# Patient Record
Sex: Male | Born: 1945 | Race: Black or African American | Hispanic: No | Marital: Single
Health system: Southern US, Community
[De-identification: ages and names within clinical notes are randomized; demographics above are authoritative.]

---

## 2015-11-20 ENCOUNTER — Encounter: Payer: Self-pay | Admitting: Sports Medicine

## 2015-11-20 ENCOUNTER — Ambulatory Visit (INDEPENDENT_AMBULATORY_CARE_PROVIDER_SITE_OTHER): Payer: Medicare Other | Admitting: Sports Medicine

## 2015-11-20 DIAGNOSIS — R600 Localized edema: Secondary | ICD-10-CM

## 2015-11-20 DIAGNOSIS — Z89511 Acquired absence of right leg below knee: Secondary | ICD-10-CM

## 2015-11-20 DIAGNOSIS — B351 Tinea unguium: Secondary | ICD-10-CM

## 2015-11-20 DIAGNOSIS — Z89521 Acquired absence of right knee: Secondary | ICD-10-CM

## 2015-11-20 DIAGNOSIS — M79673 Pain in unspecified foot: Secondary | ICD-10-CM

## 2015-11-20 NOTE — Progress Notes (Signed)
Patient ID: Leon Skinner, male   DOB: 1945/12/23, 70 y.o.   MRN: [id number] Subjective: Leon Skinner is a 70 y.o. male patient seen today in office with complaint of painful thickened and elongated toenails; unable to trim. Patient denies history of Diabetes or Neuropathy, Hx of Right BKA 3 years ago. Patient has no other pedal complaints at this time.   There are no active problems to display for this patient.  No current outpatient prescriptions on file prior to visit.   No current facility-administered medications on file prior to visit.   Not on File     Objective: Physical Exam  General: Well developed, nourished, no acute distress, awake, alert and oriented x 3 wheelchair gait  Vascular: Dorsalis pedis artery 0/4 on left, Posterior tibial artery 0/4 left, no ischemia, no gangrene, no rest pain, skin temperature warm to warm proximal to distal, no varicosities, no pedal hair present on left. Mild pitting edema on left foot.   Neurological: Gross sensation present via light touch on left.    Dermatological: Skin is warm, dry, and supple bilateral, Nails 1-5 on left are tender, long, thick, and discolored with mild subungal debris, no webspace macerations present, no open lesions present, no callus/corns/hyperkeratotic tissue present. No signs of infection.  Musculoskeletal: Right BKA. Muscular strength within normal limits without pain or limitation on range of motion on left. No pain with calf compression on left  Assessment and Plan:  Problem List Items Addressed This Visit    None    Visit Diagnoses    Dermatophytosis of nail    -  Primary    Foot pain, unspecified laterality        Hx of BKA, right        Localized edema        Left       -Examined patient.  -Discussed treatment options for painful mycotic nails. -Mechanically debrided and reduced mycotic nails with sterile nail nipper and dremel nail file without incident. -Recommend lower leg elevation to assist  with edema control -Will continue to monitor vascular status if changes or worsens will consult vascular for intervention. -Patient to return in 3 months for follow up evaluation or sooner if symptoms worsen.  Asencion Islam, DPM

## 2015-12-25 ENCOUNTER — Other Ambulatory Visit: Payer: Self-pay | Admitting: Orthopedic Surgery

## 2015-12-25 DIAGNOSIS — M25572 Pain in left ankle and joints of left foot: Secondary | ICD-10-CM

## 2016-01-05 ENCOUNTER — Ambulatory Visit
Admission: RE | Admit: 2016-01-05 | Discharge: 2016-01-05 | Disposition: A | Discharge status: Home / Self Care | Payer: Medicare Other | Source: Ambulatory Visit | Attending: Orthopedic Surgery | Admitting: Orthopedic Surgery

## 2016-01-05 DIAGNOSIS — R609 Edema, unspecified: Secondary | ICD-10-CM | POA: Insufficient documentation

## 2016-01-05 DIAGNOSIS — M25872 Other specified joint disorders, left ankle and foot: Secondary | ICD-10-CM | POA: Diagnosis not present

## 2016-01-05 DIAGNOSIS — M19072 Primary osteoarthritis, left ankle and foot: Secondary | ICD-10-CM | POA: Insufficient documentation

## 2016-01-05 DIAGNOSIS — M84372A Stress fracture, left ankle, initial encounter for fracture: Secondary | ICD-10-CM | POA: Diagnosis not present

## 2016-01-05 DIAGNOSIS — M25572 Pain in left ankle and joints of left foot: Secondary | ICD-10-CM | POA: Diagnosis not present

## 2016-01-05 MED ORDER — GADOBENATE DIMEGLUMINE 529 MG/ML IV SOLN
15.0000 mL | Freq: Once | INTRAVENOUS | Status: AC | PRN
  Administered 2016-01-05: 6 mL via INTRAVENOUS

## 2016-02-19 ENCOUNTER — Ambulatory Visit: Payer: Medicare Other | Admitting: Sports Medicine

## 2016-08-17 IMAGING — MR MR ANKLE*L* WO/W CM
10 series · 40 of 40 positions shown · IV contrast (multihance)
Comparison: None.

CLINICAL DATA: Generalized left neck pain and swelling after
falling 3 weeks ago. Renal insufficiency. Evaluate for infection.

EXAM:
MRI OF THE LEFT ANKLE WITHOUT AND WITH CONTRAST
TECHNIQUE: Multiplanar, multisequence MR imaging of the ankle was performed
before and after the administration of intravenous contrast.
CONTRAST:  6mL MULTIHANCE GADOBENATE DIMEGLUMINE 529 MG/ML IV SOLN

[Series 4: PD fat-sat · axial · 3.0mm · 0.35mm/px · z∈[-100,+77]mm · 4 of 50 slices shown]
[im 1/50]
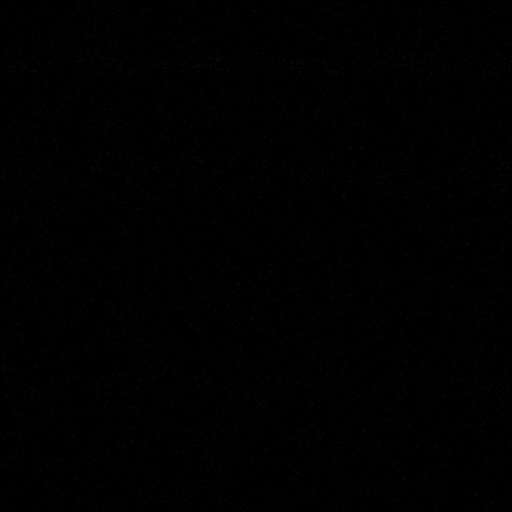
[im 17/50]
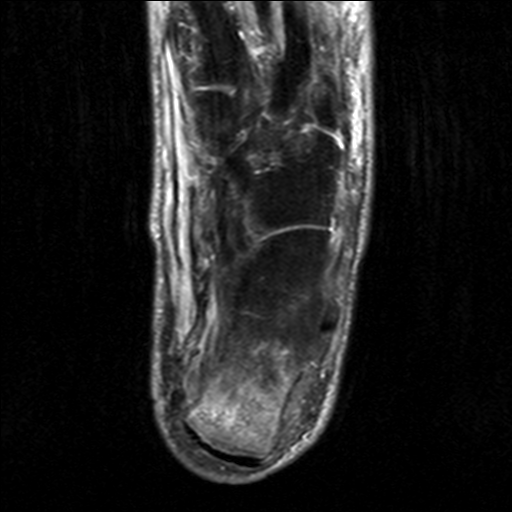
[im 33/50]
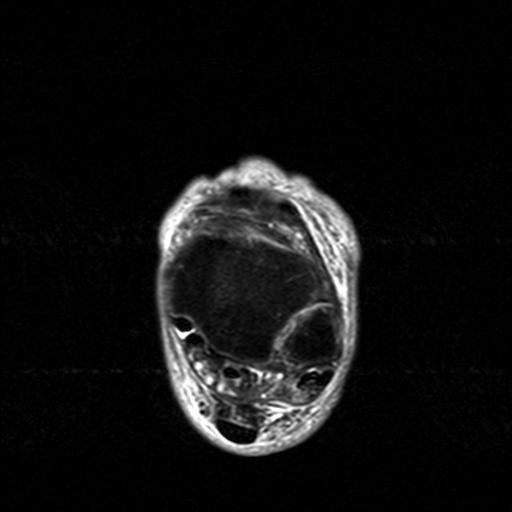
[im 50/50]
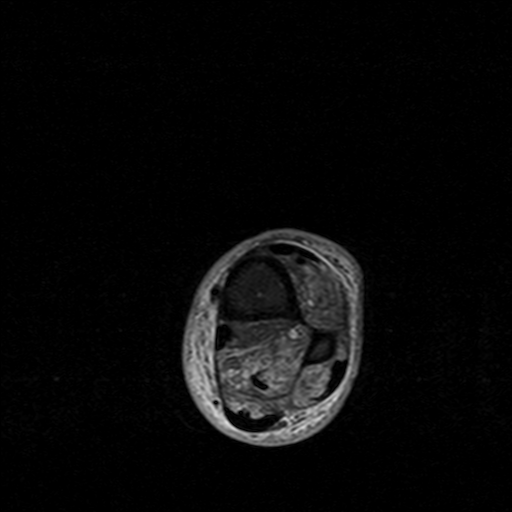

[Series 5: T2 fat-sat · axial · 3.0mm · 0.47mm/px · z∈[-100,+77]mm · 4 of 50 slices shown (1 of 2)]
[im 1/50]
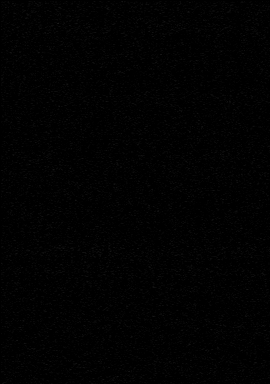
[im 17/50]
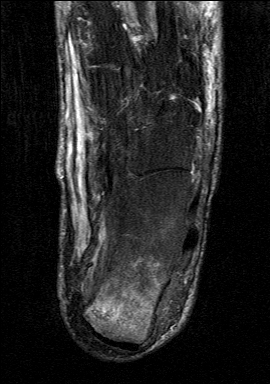
[im 33/50]
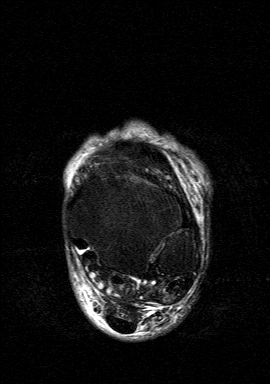
[im 50/50]
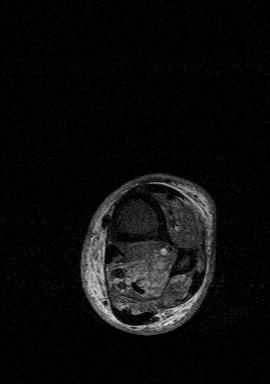

[Series 6: T1 · axial · 3.0mm · 0.70mm/px · z∈[-100,+77]mm · 4 of 50 slices shown (1 of 2)]
[im 1/50]
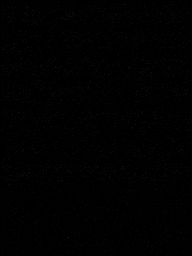
[im 17/50]
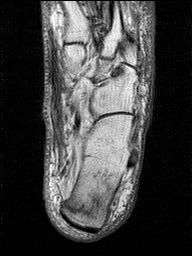
[im 33/50]
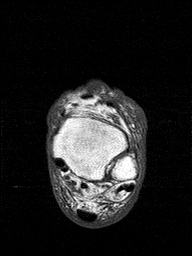
[im 50/50]
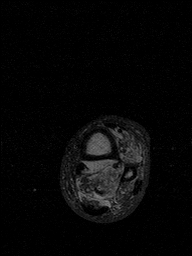

[Series 7: axial t1fs · axial · 3.0mm · 0.70mm/px · z∈[-100,+77]mm · 4 of 50 slices shown]
[im 1/50]
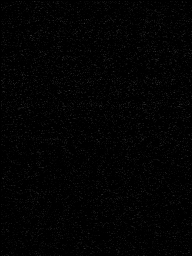
[im 17/50]
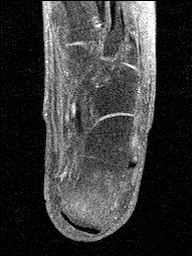
[im 33/50]
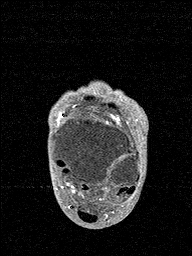
[im 50/50]
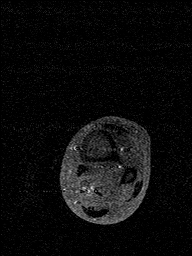

[Series 8: STIR · coronal · 3.0mm · 0.70mm/px · 5 of 50 slices shown]
[im 1/50]
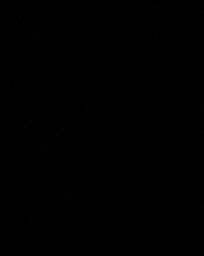
[im 13/50]
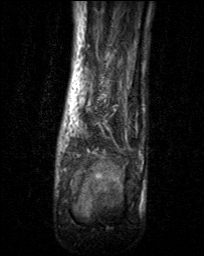
[im 25/50]
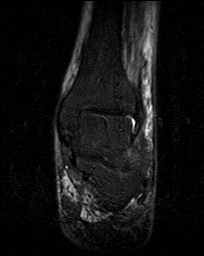
[im 37/50]
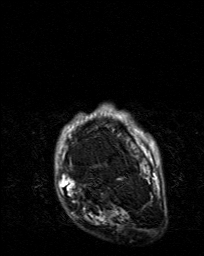
[im 50/50]
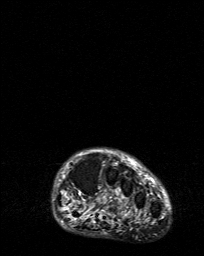

[Series 9: T1 · sagittal · 3.0mm · 0.70mm/px · 3 of 29 slices shown (2 of 2)]
[im 1/29]
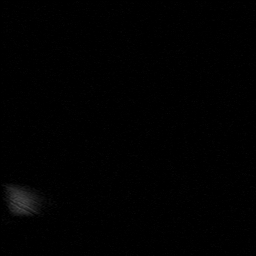
[im 15/29]
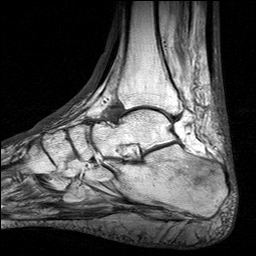
[im 29/29]
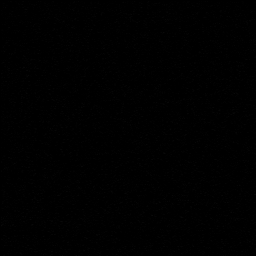

[Series 10: T2 fat-sat · sagittal · 3.0mm · 0.70mm/px · 3 of 29 slices shown (2 of 2)]
[im 1/29]
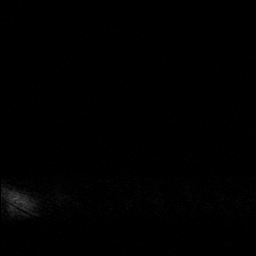
[im 15/29]
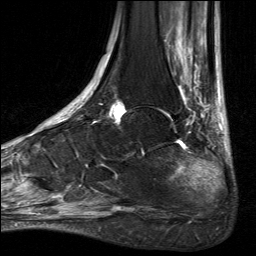
[im 29/29]
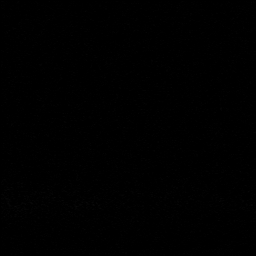

[Series 11: T1 fat-sat post-contrast · axial · 3.0mm · 0.70mm/px · z∈[-100,+77]mm · 5 of 50 slices shown (1 of 3)]
[im 1/50]
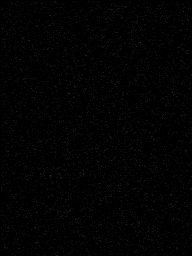
[im 13/50]
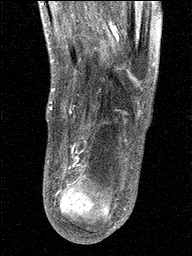
[im 25/50]
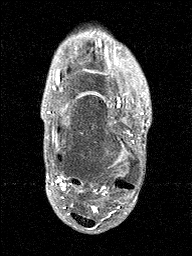
[im 37/50]
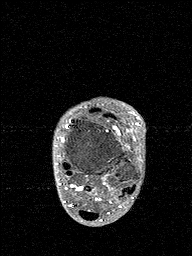
[im 50/50]
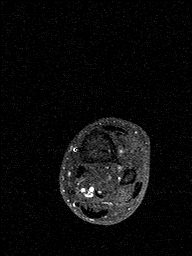

[Series 12: T1 fat-sat post-contrast · sagittal · 3.0mm · 0.70mm/px · 3 of 29 slices shown (2 of 3)]
[im 1/29]
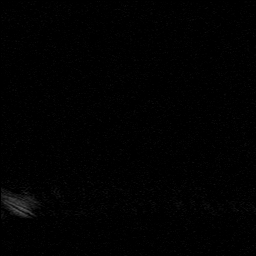
[im 15/29]
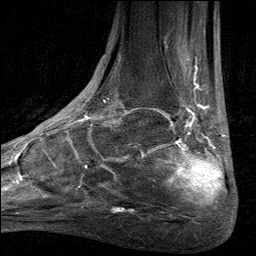
[im 29/29]
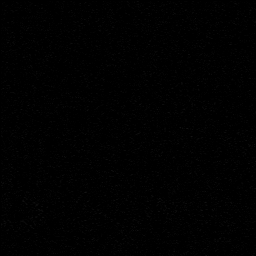

[Series 13: T1 fat-sat post-contrast · coronal · 3.0mm · 0.70mm/px · 5 of 50 slices shown (3 of 3)]
[im 1/50]
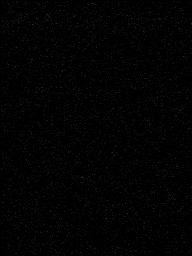
[im 13/50]
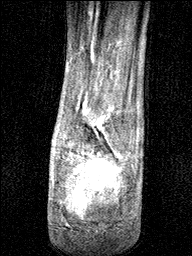
[im 25/50]
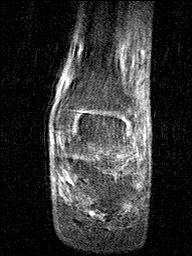
[im 37/50]
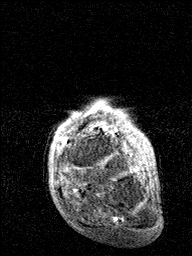
[im 50/50]
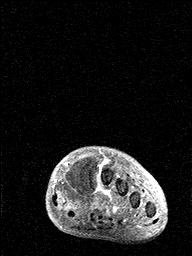

[40 of 40 positions shown; findings below may reference images not displayed]

FINDINGS: TENDONS

Peroneal: Intact and normally positioned.

Posteromedial: Intact and normally positioned.

Anterior: Intact and normally positioned.

Achilles: Intact.

Plantar Fascia: Intact.

LIGAMENTS

Lateral: Intact.

Medial: Intact.

CARTILAGE

Ankle Joint: Mild tibiotalar degenerative changes with a small
subchondral cyst medially in the talar dome. Small ankle joint
effusion.

Subtalar Joints/Sinus Tarsi: Unremarkable.

Bones: There is a trabecular stress fracture involving the superior
aspect of the calcaneal tuberosity, best seen on the T1 weighted
images. There is moderate surrounding bone marrow edema and
enhancement. There is no cortical destruction or overlying soft
tissue abnormality to suggest infection.

Other: Moderate nonspecific subcutaneous edema surrounding the
ankle. No focal fluid collection or suspicious soft tissue
enhancement following contrast.
IMPRESSION: IMPRESSION
1. Stress fracture of the calcaneal tuberosity.
2. Nonspecific soft tissue edema surrounding the ankle. No abnormal
enhancement or focal fluid collection to suggest infection.
3. Mild tibiotalar degenerative changes with small subchondral cyst
medially in the talar dome.
4. The ankle tendons and ligaments appear normal.

## 2017-05-17 DEATH — deceased
